# Patient Record
Sex: Female | Born: 1980 | Race: White | Hispanic: No | Smoking: Current every day smoker
Health system: Southern US, Community
[De-identification: ages and names within clinical notes are randomized; demographics above are authoritative.]

## PROBLEM LIST (undated history)

## (undated) DIAGNOSIS — F32A Depression, unspecified: Secondary | ICD-10-CM

## (undated) DIAGNOSIS — F419 Anxiety disorder, unspecified: Secondary | ICD-10-CM

## (undated) DIAGNOSIS — F329 Major depressive disorder, single episode, unspecified: Secondary | ICD-10-CM

---

## 2004-01-28 ENCOUNTER — Ambulatory Visit: Payer: Self-pay | Admitting: Family Medicine

## 2004-05-08 ENCOUNTER — Ambulatory Visit: Payer: Self-pay | Admitting: Family Medicine

## 2004-07-09 ENCOUNTER — Ambulatory Visit: Payer: Self-pay | Admitting: Family Medicine

## 2004-08-07 ENCOUNTER — Ambulatory Visit: Payer: Self-pay | Admitting: Family Medicine

## 2004-08-13 ENCOUNTER — Ambulatory Visit: Payer: Self-pay | Admitting: Family Medicine

## 2004-10-28 ENCOUNTER — Ambulatory Visit: Payer: Self-pay | Admitting: Family Medicine

## 2016-07-18 ENCOUNTER — Emergency Department (HOSPITAL_COMMUNITY)
Admission: EM | Admit: 2016-07-18 | Discharge: 2016-07-18 | Disposition: A | Discharge status: Home / Self Care | Payer: Self-pay | Attending: Emergency Medicine | Admitting: Emergency Medicine

## 2016-07-18 ENCOUNTER — Emergency Department (HOSPITAL_COMMUNITY): Payer: Self-pay

## 2016-07-18 ENCOUNTER — Encounter (HOSPITAL_COMMUNITY): Payer: Self-pay | Admitting: Emergency Medicine

## 2016-07-18 DIAGNOSIS — S41111A Laceration without foreign body of right upper arm, initial encounter: Secondary | ICD-10-CM | POA: Insufficient documentation

## 2016-07-18 DIAGNOSIS — Y998 Other external cause status: Secondary | ICD-10-CM | POA: Insufficient documentation

## 2016-07-18 DIAGNOSIS — F17210 Nicotine dependence, cigarettes, uncomplicated: Secondary | ICD-10-CM | POA: Insufficient documentation

## 2016-07-18 DIAGNOSIS — Y929 Unspecified place or not applicable: Secondary | ICD-10-CM | POA: Insufficient documentation

## 2016-07-18 DIAGNOSIS — W01198A Fall on same level from slipping, tripping and stumbling with subsequent striking against other object, initial encounter: Secondary | ICD-10-CM | POA: Insufficient documentation

## 2016-07-18 DIAGNOSIS — Y9389 Activity, other specified: Secondary | ICD-10-CM | POA: Insufficient documentation

## 2016-07-18 HISTORY — DX: Anxiety disorder, unspecified: F41.9

## 2016-07-18 HISTORY — DX: Major depressive disorder, single episode, unspecified: F32.9

## 2016-07-18 HISTORY — DX: Depression, unspecified: F32.A

## 2016-07-18 MED ORDER — LIDOCAINE HCL (PF) 1 % IJ SOLN
5.0000 mL | Freq: Once | INTRAMUSCULAR | Status: AC
  Administered 2016-07-18: 5 mL via INTRADERMAL
  Filled 2016-07-18: qty 5

## 2016-07-18 MED ORDER — CEPHALEXIN 500 MG PO CAPS: 500.0000 mg | ORAL_CAPSULE | Freq: Four times a day (QID) | ORAL | 0 refills | Status: AC

## 2016-07-18 MED ORDER — TETANUS-DIPHTH-ACELL PERTUSSIS 5-2.5-18.5 LF-MCG/0.5 IM SUSP
0.5000 mL | Freq: Once | INTRAMUSCULAR | Status: AC
  Administered 2016-07-18: 0.5 mL via INTRAMUSCULAR
  Filled 2016-07-18: qty 0.5

## 2016-07-18 NOTE — ED Notes (Signed)
Pt stable, ambulatory, states understanding of discharge instructions 

## 2016-07-18 NOTE — ED Triage Notes (Signed)
Pt presents with laceration to inner R upper arm after slipping off stool while cleaning car. The antenna caught underneath her arm and broke off. Bleeding controlled at this time. CSM intact.

## 2016-07-18 NOTE — ED Provider Notes (Signed)
[facility]-EMERGENCY DEPT Provider Note   CSN: [id number] Arrival date & time: 07/18/16  1953 By signing my name below, I, Sally Carter, attest that this documentation has been prepared under the direction and in the presence of non-physician practitioner, Kerrie BuffaloHope Reis Pienta, NP. Electronically Signed: Levon HedgerElizabeth Carter, Scribe. 07/18/2016. 8:39 PM.   History   Chief Complaint Chief Complaint  Patient presents with  . Laceration   HPI Sally Carter is a 36 y.o. female who presents to the Emergency Department with a laceration to her inner right upper arm sustained shortly prior to arrival. Pt was washing a truck when she fell, catching the truck antenna underneath her inner right arm. The antenna broke off the truck, but does not believe there are foreign bodies present in the wound. Bleeding controlled. Pt is unsure of current Tetanus status. She denies any syncope or any other injuries. She denies any weakness, numbness, or tingling.   The history is provided by the patient. No language interpreter was used.  Laceration   The incident occurred less than 1 hour ago. The laceration is located on the right arm. Size: 3.5. Injury mechanism: Metal car antenna. The pain is at a severity of 7/10. The pain is moderate. The pain has been constant since onset. She reports no foreign bodies present. Her tetanus status is unknown.   Past Medical History:  Diagnosis Date  . Anxiety   . Depression     There are no active problems to display for this patient.   Past Surgical History:  Procedure Laterality Date  . CESAREAN SECTION     x 2    OB History    No data available       Home Medications    Prior to Admission medications   Medication Sig Start Date End Date Taking? Authorizing Provider  cephALEXin (KEFLEX) 500 MG capsule Take 1 capsule (500 mg total) by mouth 4 (four) times daily. 07/18/16   Janne NapoleonNeese, Clarion Mooneyhan M, NP    Family History No family history on file.  Social History Social History    Substance Use Topics  . Smoking status: Current Every Day Smoker    Packs/day: 1.00    Types: Cigarettes  . Smokeless tobacco: Never Used  . Alcohol use No     Allergies   Patient has no known allergies.   Review of Systems Review of Systems  Constitutional: Negative for activity change.  Musculoskeletal: Positive for arthralgias.       Right arm  Skin: Positive for wound.  Neurological: Negative for syncope, weakness and numbness.   Physical Exam Updated Vital Signs BP 127/81   Pulse 81   Temp 99.4 F (37.4 C) (Oral)   Resp 17   Ht 5\' 1"  (1.549 m)   Wt 63.5 kg (140 lb)   LMP 06/18/2016 (Exact Date)   SpO2 99%   BMI 26.45 kg/m   Physical Exam  Constitutional: She appears well-developed and well-nourished. No distress.  HENT:  Head: Normocephalic and atraumatic.  Eyes: Conjunctivae are normal.  Neck: Neck supple.  Cardiovascular: Normal rate.   Pulmonary/Chest: Effort normal.  Abdominal: She exhibits no distension.  Musculoskeletal: Normal range of motion.  3.5 cm area to the palmar aspect of the right upper arm that is open with minimal bleeding. Area surrounding the wound is ecchymotic. There is tenderness on exam.  Neurological: She is alert.  Skin: Skin is warm and dry.  Psychiatric: She has a normal mood and affect. Her behavior is normal.  Nursing  note and vitals reviewed.  ED Treatments / Results  DIAGNOSTIC STUDIES:  Oxygen Saturation is 98% on RA, normal by my interpretation.    COORDINATION OF CARE:  8:43 PM Discussed treatment plan with pt at bedside and pt agreed to plan.   Labs (all labs ordered are listed, but only abnormal results are displayed) Labs Reviewed - No data to display   Radiology No results found.  Procedures .Marland KitchenLaceration Repair Date/Time: 07/18/2016 10:08 PM Performed by: Janne Napoleon Authorized by: Janne Napoleon   Consent:    Consent obtained:  Verbal   Consent given by:  Patient   Risks discussed:  Infection  and poor cosmetic result   Alternatives discussed:  No treatment Anesthesia (see MAR for exact dosages):    Anesthesia method:  Local infiltration   Local anesthetic:  Lidocaine 1% w/o epi Laceration details:    Location:  Shoulder/arm   Shoulder/arm location:  R upper arm   Length (cm):  3.5 Repair type:    Repair type:  Simple Pre-procedure details:    Preparation:  Patient was prepped and draped in usual sterile fashion and imaging obtained to evaluate for foreign bodies Exploration:    Hemostasis achieved with:  Direct pressure   Contaminated: no   Treatment:    Area cleansed with:  Betadine   Amount of cleaning:  Standard   Irrigation solution:  Sterile saline   Visualized foreign bodies/material removed: no   Skin repair:    Repair method:  Sutures   Suture size:  5-0   Suture material:  Prolene   Number of sutures:  4 Approximation:    Approximation:  Loose Post-procedure details:    Dressing:  Antibiotic ointment and sterile dressing   Patient tolerance of procedure:  Tolerated well, no immediate complications    (including critical care time)  Medications Ordered in ED Medications  Tdap (BOOSTRIX) injection 0.5 mL (0.5 mLs Intramuscular Given 07/18/16 2050)  lidocaine (PF) (XYLOCAINE) 1 % injection 5 mL (5 mLs Intradermal Given 07/18/16 2258)     Initial Impression / Assessment and Plan / ED Course  I have reviewed the triage vital signs and the nursing notes. Tetanus updated in ED. Laceration occurred < 12 hours prior to repair.x-ray negative for FB or fracture/dislocation. Discussed laceration care with pt and answered questions. Pt to f-u for suture removal in 7 to 10 days and wound check sooner should there be signs of dehiscence or infection. Pt is hemodynamically stable with no complaints prior to dc.    Final Clinical Impressions(s) / ED Diagnoses   Final diagnoses:  Laceration of multiple sites of right upper arm, initial encounter    New  Prescriptions There are no discharge medications for this patient. I personally performed the services described in this documentation, which was scribed in my presence. The recorded information has been reviewed and is accurate.    Kerrie Buffalo [place], [place] 07/21/16 1723    Marily Memos, MD 07/26/16 1011

## 2016-07-18 NOTE — Discharge Instructions (Signed)
Follow up for suture removal in 10 day or sooner for any problems.

## 2017-07-04 DEATH — deceased

## 2018-07-10 IMAGING — DX DG HUMERUS 2V *R*
2 series · 2 of 2 positions shown · non-contrast
Comparison: No priors.

CLINICAL DATA: 35-year-old female with history of trauma to the
right arm earlier today while washing a car.

EXAM:
RIGHT HUMERUS - 2+ VIEW

[humerus ap]
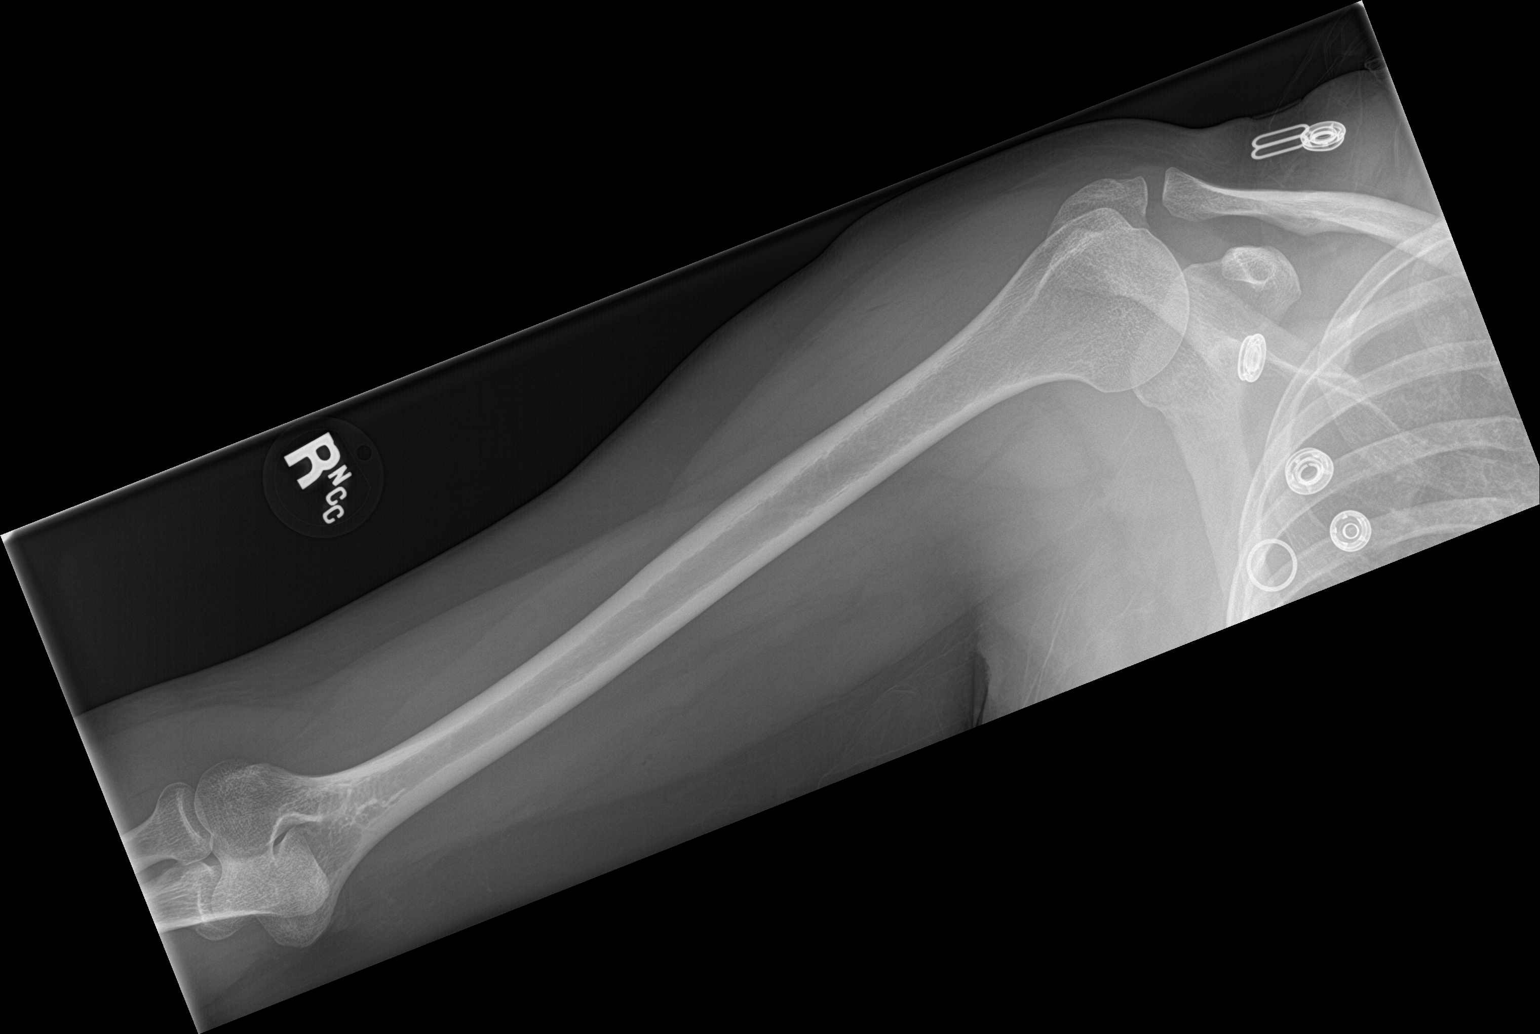

[humerus lat]
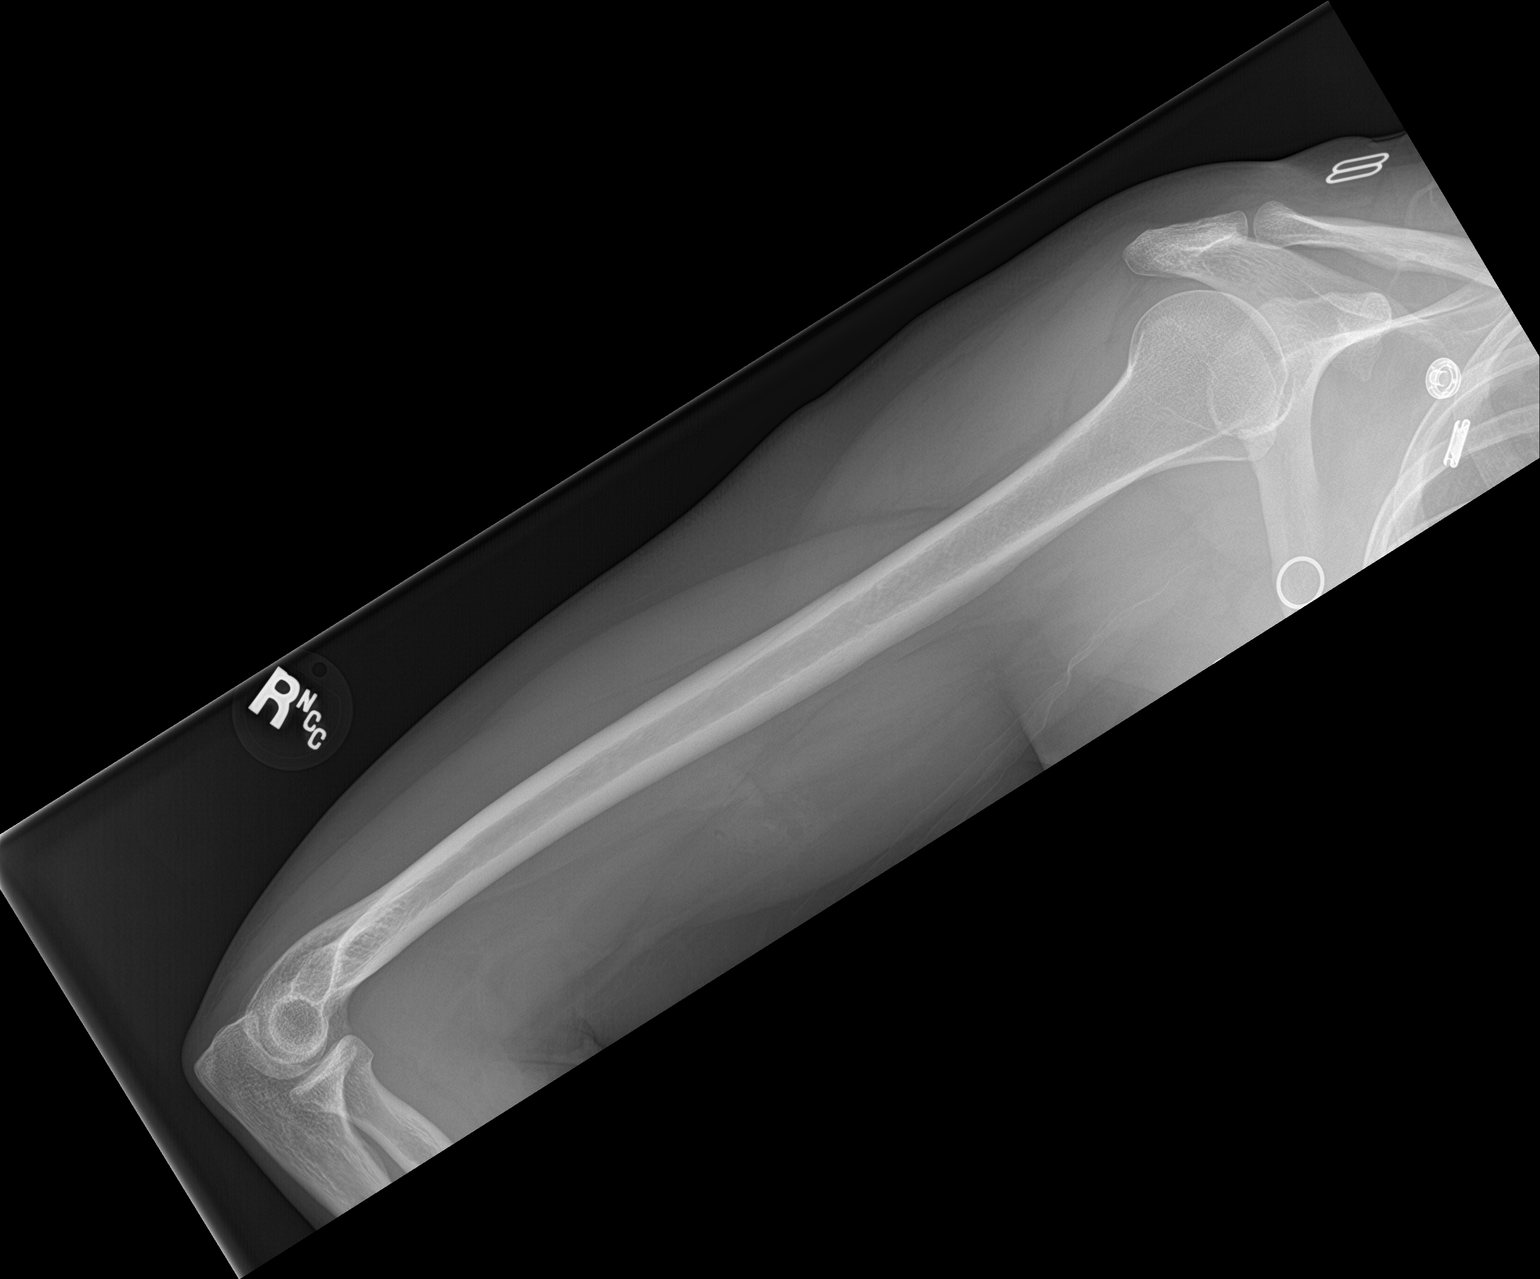

[2 of 2 positions shown; findings below may reference images not displayed]

FINDINGS: There is no evidence of fracture or other focal bone lesions. Soft
tissues are unremarkable.
IMPRESSION: Negative.
